# Patient Record
Sex: Female | Born: 2005 | Race: White | Hispanic: No | Marital: Single | Smoking: Never smoker
Health system: Southern US, Community
[De-identification: ages and names within clinical notes are randomized; demographics above are authoritative.]

## PROBLEM LIST (undated history)

## (undated) DIAGNOSIS — Z8489 Family history of other specified conditions: Secondary | ICD-10-CM

## (undated) DIAGNOSIS — R42 Dizziness and giddiness: Secondary | ICD-10-CM

## (undated) DIAGNOSIS — K219 Gastro-esophageal reflux disease without esophagitis: Secondary | ICD-10-CM

## (undated) HISTORY — PX: NO PAST SURGERIES: SHX2092

---
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
## 2024-08-19 ENCOUNTER — Ambulatory Visit

## 2024-08-19 DIAGNOSIS — K52832 Lymphocytic colitis: Secondary | ICD-10-CM | POA: Diagnosis present
